# Patient Record
Sex: Male | Born: 1975 | Race: Black or African American | Hispanic: No | Marital: Single | Smoking: Current every day smoker
Health system: Southern US, Community
[De-identification: ages and names within clinical notes are randomized; demographics above are authoritative.]

---

## 2011-03-19 ENCOUNTER — Emergency Department (HOSPITAL_COMMUNITY)
Admission: EM | Admit: 2011-03-19 | Discharge: 2011-03-19 | Disposition: A | Discharge status: Home / Self Care | Payer: Self-pay | Attending: Emergency Medicine | Admitting: Emergency Medicine

## 2011-03-19 DIAGNOSIS — K047 Periapical abscess without sinus: Secondary | ICD-10-CM | POA: Insufficient documentation

## 2011-03-19 DIAGNOSIS — K089 Disorder of teeth and supporting structures, unspecified: Secondary | ICD-10-CM | POA: Insufficient documentation

## 2011-03-19 DIAGNOSIS — I10 Essential (primary) hypertension: Secondary | ICD-10-CM | POA: Insufficient documentation

## 2011-05-11 ENCOUNTER — Emergency Department (HOSPITAL_COMMUNITY)
Admission: EM | Admit: 2011-05-11 | Discharge: 2011-05-12 | Disposition: A | Discharge status: Nursing Home (Includes ICF) | Payer: Self-pay | Attending: Emergency Medicine | Admitting: Emergency Medicine

## 2011-05-11 ENCOUNTER — Emergency Department (HOSPITAL_COMMUNITY): Payer: Self-pay

## 2011-05-11 DIAGNOSIS — K122 Cellulitis and abscess of mouth: Secondary | ICD-10-CM

## 2011-05-11 DIAGNOSIS — R499 Unspecified voice and resonance disorder: Secondary | ICD-10-CM | POA: Insufficient documentation

## 2011-05-11 DIAGNOSIS — L03211 Cellulitis of face: Secondary | ICD-10-CM | POA: Insufficient documentation

## 2011-05-11 DIAGNOSIS — L0201 Cutaneous abscess of face: Secondary | ICD-10-CM | POA: Insufficient documentation

## 2011-05-11 DIAGNOSIS — R1310 Dysphagia, unspecified: Secondary | ICD-10-CM | POA: Insufficient documentation

## 2011-05-11 DIAGNOSIS — R220 Localized swelling, mass and lump, head: Secondary | ICD-10-CM | POA: Insufficient documentation

## 2011-05-11 DIAGNOSIS — F17200 Nicotine dependence, unspecified, uncomplicated: Secondary | ICD-10-CM | POA: Insufficient documentation

## 2011-05-11 DIAGNOSIS — R599 Enlarged lymph nodes, unspecified: Secondary | ICD-10-CM | POA: Insufficient documentation

## 2011-05-11 DIAGNOSIS — M542 Cervicalgia: Secondary | ICD-10-CM | POA: Insufficient documentation

## 2011-05-11 DIAGNOSIS — K029 Dental caries, unspecified: Secondary | ICD-10-CM | POA: Insufficient documentation

## 2011-05-11 DIAGNOSIS — R509 Fever, unspecified: Secondary | ICD-10-CM | POA: Insufficient documentation

## 2011-05-11 DIAGNOSIS — R070 Pain in throat: Secondary | ICD-10-CM | POA: Insufficient documentation

## 2011-05-11 LAB — BASIC METABOLIC PANEL
Chloride: 97 mEq/L (ref 96–112)
Creatinine, Ser: 0.88 mg/dL (ref 0.50–1.35)
GFR calc Af Amer: >60 mL/min (ref >60)
GFR calc non Af Amer: >60 mL/min (ref >60)
Potassium: 3.7 mEq/L (ref 3.5–5.1)

## 2011-05-11 LAB — DIFFERENTIAL
Basophils Absolute: 0 10*3/uL (ref 0.0–0.1)
Basophils Relative: 0 % (ref 0–1)
Lymphocytes Relative: 12 % (ref 12–46)
Monocytes Absolute: 2.1 10*3/uL — ABNORMAL HIGH (ref 0.1–1.0)
Neutro Abs: 12.3 10*3/uL — ABNORMAL HIGH (ref 1.7–7.7)
Neutrophils Relative %: 75 % (ref 43–77)

## 2011-05-11 LAB — CBC
HCT: 44.6 % (ref 39.0–52.0)
Hemoglobin: 15.1 g/dL (ref 13.0–17.0)
MCHC: 33.9 g/dL (ref 30.0–36.0)
RDW: 14.1 % (ref 11.5–15.5)
WBC: 16.5 10*3/uL — ABNORMAL HIGH (ref 4.0–10.5)

## 2011-05-11 LAB — LACTIC ACID, PLASMA: Lactic Acid, Venous: 1 mmol/L (ref 0.5–2.2)

## 2011-05-11 LAB — RAPID STREP SCREEN (MED CTR MEBANE ONLY): Streptococcus, Group A Screen (Direct): NEGATIVE

## 2011-05-11 LAB — PROCALCITONIN: Procalcitonin: 0.16 ng/mL

## 2011-05-11 MED ORDER — IOHEXOL 300 MG/ML  SOLN
100.0000 mL | Freq: Once | INTRAMUSCULAR | Status: AC | PRN
  Administered 2011-05-11: 100 mL via INTRAVENOUS

## 2011-05-11 MED ORDER — PIPERACILLIN-TAZOBACTAM 3.375 G IVPB
3.3750 g | Freq: Once | INTRAVENOUS | Status: AC
  Administered 2011-05-11: 3.375 g via INTRAVENOUS
  Filled 2011-05-11: qty 50

## 2011-05-11 MED ORDER — ACETAMINOPHEN 500 MG PO TABS
1000.0000 mg | ORAL_TABLET | Freq: Once | ORAL | Status: AC
  Administered 2011-05-11: 1000 mg via ORAL
  Filled 2011-05-11: qty 2

## 2011-05-11 MED ORDER — PIPERACILLIN-TAZOBACTAM 3.375 G IVPB
Freq: Three times a day (TID) | INTRAVENOUS | Status: DC
  Filled 2011-05-11: qty 50

## 2011-05-11 MED ORDER — SODIUM CHLORIDE 0.9 % IV SOLN
INTRAVENOUS | Status: DC
  Administered 2011-05-11: 20:00:00 via INTRAVENOUS

## 2011-05-11 MED ORDER — KETOROLAC TROMETHAMINE 60 MG/2ML IM SOLN
60.0000 mg | Freq: Once | INTRAMUSCULAR | Status: AC
  Administered 2011-05-11: 60 mg via INTRAMUSCULAR
  Filled 2011-05-11: qty 2

## 2011-05-11 MED ORDER — SODIUM CHLORIDE 0.9 % IV BOLUS (SEPSIS): 1000.0000 mL | Freq: Once | INTRAVENOUS | Status: DC

## 2011-05-11 MED ORDER — SODIUM CHLORIDE 0.9 % IV SOLN: INTRAVENOUS | Status: DC

## 2011-05-11 MED ORDER — LIDOCAINE VISCOUS 2 % MT SOLN
20.0000 mL | Freq: Once | OROMUCOSAL | Status: AC
  Administered 2011-05-11: 20 mL via OROMUCOSAL
  Filled 2011-05-11: qty 20

## 2011-05-11 MED ORDER — HYDROMORPHONE HCL 1 MG/ML IJ SOLN
1.0000 mg | Freq: Once | INTRAMUSCULAR | Status: DC
  Filled 2011-05-11 (×2): qty 1

## 2011-05-11 NOTE — ED Provider Notes (Addendum)
Pt seen suspect left submandibular cellulitis vs abscess, ordered CT/antibiotics, posterior pharynx appears normal, no stridor/drool or airway compromise. 7:35 PM Caleen Essex, MD 05/11/11 1936  Tooth #19 with large carie and local gingival tenderness suspected source.  Pt stable in ED with no significant deterioration in condition. Patient / Family / Caregiver informed of clinical course, understand medical decision-making process, and agree with plan. Pt accepted by Dr. Carmon Ginsberg at [facility].  Pt to report to ED via [facility].  Hurman Horn, MD 05/13/11 2247  Medical screening examination/treatment/procedure(s) were conducted as a shared visit with non-physician practitioner(s) and myself.  I personally evaluated the patient during the encounter Caleen Essex, MD 05/13/11 2248

## 2011-05-11 NOTE — ED Notes (Signed)
Pt c/o sore throat x 2 days. Difficult to swallow strep test obtained at time of triage. Pt's wife states has had toothache inLt lower back also for several days.  Tooth appears broken. Dificulity seeing tooth secondary to swelling in lt jaw and neck.  Tylenol given for temp

## 2011-05-11 NOTE — ED Notes (Signed)
Pt presents with sore and swollen throat. Pt states symptoms started yesterday.

## 2011-05-11 NOTE — ED Provider Notes (Signed)
History     Chief Complaint  Patient presents with  . Sore Throat   Patient is a 35 y.o. male presenting with pharyngitis.  Sore Throat This is a new problem. The current episode started yesterday. The problem occurs constantly. The problem has been gradually worsening. Associated symptoms include a fever, neck pain, a sore throat and swollen glands. Pertinent negatives include no abdominal pain, arthralgias, chest pain, congestion, coughing, headaches, joint swelling, nausea, numbness, rash or weakness. The symptoms are aggravated by swallowing. He has tried acetaminophen for the symptoms. The treatment provided no relief.    History reviewed. No pertinent past medical history.  History reviewed. No pertinent past surgical history.  History reviewed. No pertinent family history.  History  Substance Use Topics  . Smoking status: Current Everyday Smoker -- 1.0 packs/day    Types: Cigarettes  . Smokeless tobacco: Not on file  . Alcohol Use: No      Review of Systems  Constitutional: Positive for fever.  HENT: Positive for sore throat, facial swelling, trouble swallowing, neck pain and voice change. Negative for congestion and neck stiffness.   Eyes: Negative.   Respiratory: Negative for cough, chest tightness and shortness of breath.   Cardiovascular: Negative for chest pain.  Gastrointestinal: Negative for nausea and abdominal pain.  Genitourinary: Negative.   Musculoskeletal: Negative for joint swelling and arthralgias.  Skin: Negative.  Negative for rash and wound.  Neurological: Negative for dizziness, weakness, light-headedness, numbness and headaches.  Hematological: Negative.   Psychiatric/Behavioral: Negative.     Physical Exam  BP 123/84  Pulse 134  Temp(Src) 101.7 F (38.7 C) (Oral)  Resp 20  Ht 6' (1.829 m)  Wt 291 lb (131.997 kg)  BMI 39.47 kg/m2  SpO2 99%  Physical Exam  Vitals reviewed. Constitutional: He is oriented to person, place, and time. He  appears well-developed and well-nourished.  HENT:  Head: Normocephalic and atraumatic.    Right Ear: External ear normal.  Left Ear: External ear normal.  Nose: Nose normal.  Mouth/Throat: Uvula is midline, oropharynx is clear and moist and mucous membranes are normal. No dental abscesses. No oropharyngeal exudate, posterior oropharyngeal edema or tonsillar abscesses.    Eyes: Conjunctivae are normal.  Neck: No tracheal deviation present. No thyromegaly present.  Cardiovascular: Normal heart sounds and intact distal pulses.  Tachycardia present.   Pulmonary/Chest: Effort normal and breath sounds normal. No stridor. He has no wheezes.  Abdominal: Soft. Bowel sounds are normal. There is no tenderness.  Musculoskeletal: Normal range of motion.  Lymphadenopathy:    He has cervical adenopathy.  Neurological: He is alert and oriented to person, place, and time.  Skin: Skin is warm and dry.  Psychiatric: He has a normal mood and affect.    ED Course  Procedures  Spoke with Dr. Sloan Leiter,  General dentistry (no oral surgeon on call tonight) via [facility].  Deferred to maxillofacial - Dr. Annalee Genta.  MDM       Candis Musa, PA 05/11/11 2249

## 2011-05-12 MED ORDER — ACETAMINOPHEN 500 MG PO TABS
ORAL_TABLET | ORAL | Status: AC
  Administered 2011-05-12
  Filled 2011-05-12: qty 2

## 2011-05-12 NOTE — ED Notes (Signed)
Report given to charlene rn in er.

## 2011-05-13 NOTE — ED Provider Notes (Signed)
Medical screening examination/treatment/procedure(s) were conducted as a shared visit with non-physician practitioner(s) and myself.  I personally evaluated the patient during the encounter  Hurman Horn, MD 05/13/11 [phone]

## 2014-02-15 ENCOUNTER — Ambulatory Visit (HOSPITAL_COMMUNITY): Payer: Self-pay | Admitting: Psychiatry

## 2014-04-17 ENCOUNTER — Ambulatory Visit (INDEPENDENT_AMBULATORY_CARE_PROVIDER_SITE_OTHER): Payer: Self-pay | Admitting: Emergency Medicine

## 2014-04-17 VITALS — BP 122/74 | HR 90 | Temp 98.4°F | Resp 16 | Ht 71.0 in | Wt 310.0 lb

## 2014-04-17 DIAGNOSIS — Z0289 Encounter for other administrative examinations: Secondary | ICD-10-CM

## 2014-04-17 NOTE — Progress Notes (Signed)
[facility] [address], [place] [id number] [phone]  Date:  04/17/2014   Name:  Italyhad L Calandro   DOB:  Jul 24, 1976   MRN:  [id number]  PCP:  No PCP Per Patient    Chief Complaint: Employment Physical   History of Present Illness:  Italyhad L Mcguffin is a 38 y.o. very pleasant male patient who presents with the following:  DOT examination.  Denies other complaint or health concern today.   There are no active problems to display for this patient.   History reviewed. No pertinent past medical history.  History reviewed. No pertinent past surgical history.  History  Substance Use Topics  . Smoking status: Current Every Day Smoker -- 1.00 packs/day    Types: Cigarettes  . Smokeless tobacco: Not on file  . Alcohol Use: No    History reviewed. No pertinent family history.  No Known Allergies  Medication list has been reviewed and updated.  No current outpatient prescriptions on file prior to visit.   No current facility-administered medications on file prior to visit.    Review of Systems:  As per HPI, otherwise negative.    Physical Examination: Filed Vitals:   04/17/14 1233  BP: 122/74  Pulse: 90  Temp: 98.4 F (36.9 C)  Resp: 16   Filed Vitals:   04/17/14 1233  Height: 5\' 11"  (1.803 m)  Weight: 310 lb (140.615 kg)   Body mass index is 43.26 kg/(m^2). Ideal Body Weight: Weight in (lb) to have BMI = 25: 178.9  GEN: WDWN, NAD, Non-toxic, A & O x 3 HEENT: Atraumatic, Normocephalic. Neck supple. No masses, No LAD. Ears and Nose: No external deformity. CV: RRR, No M/G/R. No JVD. No thrill. No extra heart sounds. PULM: CTA B, no wheezes, crackles, rhonchi. No retractions. No resp. distress. No accessory muscle use. ABD: S, NT, ND, +BS. No rebound. No HSM. EXTR: No c/c/e NEURO Normal gait.  PSYCH: Normally interactive. Conversant. Not depressed or anxious appearing.  Calm demeanor.    Assessment and Plan: DOT  certification  Signed,  Phillips OdorJeffery Anderson, MD

## 2014-09-19 ENCOUNTER — Telehealth: Payer: Self-pay | Admitting: *Deleted

## 2014-09-19 NOTE — Telephone Encounter (Signed)
Open in error

## 2016-12-18 ENCOUNTER — Emergency Department (HOSPITAL_COMMUNITY)
Admission: EM | Admit: 2016-12-18 | Discharge: 2016-12-18 | Disposition: A | Discharge status: Home / Self Care | Payer: BLUE CROSS/BLUE SHIELD | Attending: Emergency Medicine | Admitting: Emergency Medicine

## 2016-12-18 ENCOUNTER — Encounter (HOSPITAL_COMMUNITY): Payer: Self-pay | Admitting: Emergency Medicine

## 2016-12-18 DIAGNOSIS — R197 Diarrhea, unspecified: Secondary | ICD-10-CM | POA: Insufficient documentation

## 2016-12-18 DIAGNOSIS — B001 Herpesviral vesicular dermatitis: Secondary | ICD-10-CM | POA: Insufficient documentation

## 2016-12-18 DIAGNOSIS — F17210 Nicotine dependence, cigarettes, uncomplicated: Secondary | ICD-10-CM | POA: Diagnosis not present

## 2016-12-18 DIAGNOSIS — R1030 Lower abdominal pain, unspecified: Secondary | ICD-10-CM | POA: Diagnosis present

## 2016-12-18 DIAGNOSIS — R112 Nausea with vomiting, unspecified: Secondary | ICD-10-CM | POA: Insufficient documentation

## 2016-12-18 LAB — URINALYSIS, ROUTINE W REFLEX MICROSCOPIC
Bacteria, UA: NONE SEEN
Bilirubin Urine: NEGATIVE
Glucose, UA: NEGATIVE mg/dL
Ketones, ur: NEGATIVE mg/dL
LEUKOCYTES UA: NEGATIVE
Nitrite: NEGATIVE
PROTEIN: NEGATIVE mg/dL
Specific Gravity, Urine: 1.016 (ref 1.005–1.030)
pH: 5 (ref 5.0–8.0)

## 2016-12-18 LAB — COMPREHENSIVE METABOLIC PANEL
ALBUMIN: 4.5 g/dL (ref 3.5–5.0)
ALK PHOS: 43 U/L (ref 38–126)
ALT: 42 U/L (ref 17–63)
AST: 33 U/L (ref 15–41)
Anion gap: 7 (ref 5–15)
BILIRUBIN TOTAL: 0.3 mg/dL (ref 0.3–1.2)
BUN: 15 mg/dL (ref 6–20)
CALCIUM: 9.7 mg/dL (ref 8.9–10.3)
CO2: 26 mmol/L (ref 22–32)
CREATININE: 0.87 mg/dL (ref 0.61–1.24)
Chloride: 104 mmol/L (ref 101–111)
GFR calc Af Amer: >60 mL/min (ref >60)
GFR calc non Af Amer: >60 mL/min (ref >60)
GLUCOSE: 120 mg/dL — AB (ref 65–99)
Potassium: 4 mmol/L (ref 3.5–5.1)
SODIUM: 137 mmol/L (ref 135–145)
TOTAL PROTEIN: 7.8 g/dL (ref 6.5–8.1)

## 2016-12-18 LAB — CBC
HEMATOCRIT: 46 % (ref 39.0–52.0)
HEMOGLOBIN: 15.8 g/dL (ref 13.0–17.0)
MCH: 29.1 pg (ref 26.0–34.0)
MCHC: 34.3 g/dL (ref 30.0–36.0)
MCV: 84.7 fL (ref 78.0–100.0)
PLATELETS: 254 10*3/uL (ref 150–400)
RBC: 5.43 MIL/uL (ref 4.22–5.81)
RDW: 14.4 % (ref 11.5–15.5)
WBC: 7.6 10*3/uL (ref 4.0–10.5)

## 2016-12-18 LAB — LIPASE, BLOOD: Lipase: 15 U/L (ref 11–51)

## 2016-12-18 MED ORDER — DIPHENOXYLATE-ATROPINE 2.5-0.025 MG PO TABS: 1.0000 | ORAL_TABLET | Freq: Four times a day (QID) | ORAL | 0 refills | Status: AC | PRN

## 2016-12-18 MED ORDER — VALACYCLOVIR HCL 500 MG PO TABS
2000.0000 mg | ORAL_TABLET | Freq: Once | ORAL | Status: AC
  Administered 2016-12-18: 2000 mg via ORAL
  Filled 2016-12-18: qty 4

## 2016-12-18 MED ORDER — VALACYCLOVIR HCL 500 MG PO TABS: ORAL_TABLET | ORAL | 0 refills | Status: AC

## 2016-12-18 MED ORDER — DIPHENOXYLATE-ATROPINE 2.5-0.025 MG PO TABS
2.0000 | ORAL_TABLET | Freq: Once | ORAL | Status: AC
  Administered 2016-12-18: 2 via ORAL
  Filled 2016-12-18: qty 2

## 2016-12-18 MED ORDER — ONDANSETRON 4 MG PO TBDP: 4.0000 mg | ORAL_TABLET | Freq: Three times a day (TID) | ORAL | 0 refills | Status: AC | PRN

## 2016-12-18 NOTE — ED Triage Notes (Signed)
PT reports upper abd pain since yesterday with n/v/d.

## 2016-12-18 NOTE — ED Provider Notes (Signed)
[facility]-EMERGENCY DEPT Provider Note   CSN: [id number] Arrival date & time: 12/18/16  0708     History   Chief Complaint Chief Complaint  Patient presents with  . Abdominal Pain    HPI Randall Blackburn is a 41 y.o. male presenting with a 24 hour history of nausea, vomiting and diarrhea in association with lower abdominal pain which is improved today, stating he "just feels sore" across his lower abdomen today.  His last episode of diarrhea was at 6 am, reports partially formed, denies blood or mucus in stool, and last episode of emesis occurring yesterday evening.  He denies fevers, chills, dizziness or weakness.  He denies any known contacts with similar symptoms and has had no medicines for his symptoms prior to arrival.  He denies foreign travel and has had no recent foods suspicious for being undercooked or spoiled.    Additionally has complaint of increasing frequency of "cold sores",  Stating for the past 6 months has been getting one every 3 weeks on average. He uses abreva topical which does not seem to help much.  His current cold sore came up yesterday morning.    The history is provided by the patient.    History reviewed. No pertinent past medical history.  There are no active problems to display for this patient.   History reviewed. No pertinent surgical history.     Home Medications    Prior to Admission medications   Medication Sig Start Date End Date Taking? Authorizing Provider  diphenoxylate-atropine (LOMOTIL) 2.5-0.025 MG tablet Take 1 tablet by mouth 4 (four) times daily as needed for diarrhea or loose stools. 12/18/16   Burgess AmorJulie Adalia Pettis, PA-C  ondansetron (ZOFRAN ODT) 4 MG disintegrating tablet Take 1 tablet (4 mg total) by mouth every 8 (eight) hours as needed for nausea or vomiting. 12/18/16   Burgess AmorJulie Benjamen Koelling, PA-C  valACYclovir (VALTREX) 500 MG tablet Take 2000 mg x 1 in 12 hours (4 tablets),  Then start taking one tablet daily for suppression starting tomorrow. 12/18/16    Burgess AmorJulie Takirah Binford, PA-C    Family History History reviewed. No pertinent family history.  Social History Social History  Substance Use Topics  . Smoking status: Current Every Day Smoker    Packs/day: 1.00    Types: Cigarettes  . Smokeless tobacco: Not on file  . Alcohol use No     Allergies   Patient has no known allergies.   Review of Systems Review of Systems  Constitutional: Negative for chills and fever.  HENT: Positive for mouth sores. Negative for congestion and sore throat.   Eyes: Negative.   Respiratory: Negative for chest tightness and shortness of breath.   Cardiovascular: Negative for chest pain.  Gastrointestinal: Positive for abdominal pain, diarrhea, nausea and vomiting.  Genitourinary: Negative.   Musculoskeletal: Negative for arthralgias, joint swelling and neck pain.  Skin: Negative.  Negative for rash and wound.  Neurological: Negative for dizziness, weakness, light-headedness, numbness and headaches.  Psychiatric/Behavioral: Negative.      Physical Exam Updated Vital Signs BP 154/99 (BP Location: Left Arm)   Pulse 110   Temp 98.5 F (36.9 C) (Oral)   Resp 18   Ht 6' (1.829 m)   Wt (!) 139.3 kg   SpO2 97%   BMI 41.64 kg/m   Physical Exam  Constitutional: He appears well-developed and well-nourished.  HENT:  Head: Normocephalic and atraumatic.  Mouth/Throat: Oropharynx is clear and moist.  Herpes labialis lesion left lower lip.  Eyes: Conjunctivae are  normal.  Neck: Normal range of motion.  Cardiovascular: Normal rate, regular rhythm, normal heart sounds and intact distal pulses.   Pulmonary/Chest: Effort normal and breath sounds normal. He has no wheezes.  Abdominal: Soft. Bowel sounds are normal. He exhibits no distension and no mass. There is no tenderness. There is no rebound and no guarding.  Musculoskeletal: Normal range of motion.  Neurological: He is alert.  Skin: Skin is warm and dry.  Psychiatric: He has a normal mood and affect.    Nursing note and vitals reviewed.    ED Treatments / Results  Labs (all labs ordered are listed, but only abnormal results are displayed) Labs Reviewed  COMPREHENSIVE METABOLIC PANEL - Abnormal; Notable for the following:       Result Value   Glucose, Bld 120 (*)    All other components within normal limits  URINALYSIS, ROUTINE W REFLEX MICROSCOPIC - Abnormal; Notable for the following:    Hgb urine dipstick SMALL (*)    All other components within normal limits  LIPASE, BLOOD  CBC    EKG  EKG Interpretation None       Radiology No results found.  Procedures Procedures (including critical care time)  Medications Ordered in ED Medications  valACYclovir (VALTREX) tablet 2,000 mg (not administered)     Initial Impression / Assessment and Plan / ED Course  I have reviewed the triage vital signs and the nursing notes.  Pertinent labs & imaging results that were available during my care of the patient were reviewed by me and considered in my medical decision making (see chart for details).     Pt with probable viral gastroenteritis, exam benign, abdominal exam without guarding or suspicion for acute abdomen.  He was placed on lomotil, first dose given here.  zofran prescribed prn for nausea.  Bland diet, recheck for persistent sx.  He was started on valtrex for acute breakout and then start on daily immunosuppression tx.  Advised he will need pcp for further guidance with this - referrals given.    The patient appears reasonably screened and/or stabilized for discharge and I doubt any other medical condition or other [facility] requiring further screening, evaluation, or treatment in the ED at this time prior to discharge.   Final Clinical Impressions(s) / ED Diagnoses   Final diagnoses:  Nausea vomiting and diarrhea  Herpes labialis    New Prescriptions New Prescriptions   DIPHENOXYLATE-ATROPINE (LOMOTIL) 2.5-0.025 MG TABLET    Take 1 tablet by mouth 4 (four) times  daily as needed for diarrhea or loose stools.   ONDANSETRON (ZOFRAN ODT) 4 MG DISINTEGRATING TABLET    Take 1 tablet (4 mg total) by mouth every 8 (eight) hours as needed for nausea or vomiting.   VALACYCLOVIR (VALTREX) 500 MG TABLET    Take 2000 mg x 1 in 12 hours (4 tablets),  Then start taking one tablet daily for suppression starting tomorrow.     Burgess Amor, PA-C 12/18/16 1610    Eber Hong, MD 12/19/16 [phone]

## 2017-10-24 ENCOUNTER — Encounter (HOSPITAL_COMMUNITY): Payer: Self-pay | Admitting: *Deleted

## 2017-10-24 ENCOUNTER — Emergency Department (HOSPITAL_COMMUNITY)
Admission: EM | Admit: 2017-10-24 | Discharge: 2017-10-24 | Disposition: A | Discharge status: Home / Self Care | Payer: BLUE CROSS/BLUE SHIELD | Attending: Emergency Medicine | Admitting: Emergency Medicine

## 2017-10-24 ENCOUNTER — Emergency Department (HOSPITAL_COMMUNITY): Payer: BLUE CROSS/BLUE SHIELD

## 2017-10-24 DIAGNOSIS — M545 Low back pain, unspecified: Secondary | ICD-10-CM

## 2017-10-24 DIAGNOSIS — F17210 Nicotine dependence, cigarettes, uncomplicated: Secondary | ICD-10-CM | POA: Diagnosis not present

## 2017-10-24 DIAGNOSIS — L723 Sebaceous cyst: Secondary | ICD-10-CM | POA: Diagnosis not present

## 2017-10-24 DIAGNOSIS — Z79899 Other long term (current) drug therapy: Secondary | ICD-10-CM | POA: Diagnosis not present

## 2017-10-24 DIAGNOSIS — X500XXA Overexertion from strenuous movement or load, initial encounter: Secondary | ICD-10-CM | POA: Diagnosis not present

## 2017-10-24 MED ORDER — KETOROLAC TROMETHAMINE 60 MG/2ML IM SOLN
60.0000 mg | Freq: Once | INTRAMUSCULAR | Status: AC
  Administered 2017-10-24: 60 mg via INTRAMUSCULAR
  Filled 2017-10-24: qty 2

## 2017-10-24 MED ORDER — HYDROCODONE-ACETAMINOPHEN 5-325 MG PO TABS: 1.0000 | ORAL_TABLET | ORAL | 0 refills | Status: AC | PRN

## 2017-10-24 MED ORDER — LIDOCAINE HCL (PF) 1 % IJ SOLN
5.0000 mL | Freq: Once | INTRAMUSCULAR | Status: AC
  Administered 2017-10-24: 5 mL
  Filled 2017-10-24: qty 6

## 2017-10-24 MED ORDER — CYCLOBENZAPRINE HCL 5 MG PO TABS: 5.0000 mg | ORAL_TABLET | Freq: Three times a day (TID) | ORAL | 0 refills | Status: AC | PRN

## 2017-10-24 MED ORDER — NAPROXEN 500 MG PO TABS: 500.0000 mg | ORAL_TABLET | Freq: Two times a day (BID) | ORAL | 0 refills | Status: AC

## 2017-10-24 NOTE — ED Triage Notes (Signed)
Ambulatory to triage with a steady gait. Pt reports lower back pain that started last night while at work, pt does loading and unloading type work. Also reports some right hip pain. No meds PTA.

## 2017-10-24 NOTE — Discharge Instructions (Signed)
Take the medicines as directed.  Do not drive within 4 hours of taking hydrocodone as this will make you drowsy.  Avoid lifting,  Bending,  Twisting or any other activity that worsens your pain over the next week.  Apply an  icepack  to your lower back for 10-15 minutes every 2 hours for the next 2 days.  You should get rechecked if your symptoms are not better over the next 5 days,  Or you develop increased pain,  Weakness in your leg(s) or loss of bladder or bowel function - these are symptoms of a worsening condition.  Apply a warm soak (shower) to the site of the sebaceous cyst for 5 minutes daily the next several days.  This should heal without complication.  However as discussed it is possible for this to return slowly over time.

## 2017-10-25 NOTE — ED Provider Notes (Signed)
[facility] EMERGENCY DEPARTMENT Provider Note   CSN: [id number] Arrival date & time: 10/24/17  1851     History   Chief Complaint Chief Complaint  Patient presents with  . Back Pain    HPI Randall Blackburn is a 41 y.o. male presenting with 2 complaints today, the first being midline low back pain which is been present since last night.  He reports having to lift objects up to 120 pounds at work describing big coils of metal when he felt sudden onset of pain in his mid lumbar spine.  He denies radiation of pain in his lower extremities, no weakness or numbness in his legs.  Additionally has had no urinary or fecal incontinence or retention and denies prior history of significant back problems.  He has taken Tylenol with no relief of symptoms.  His second complaint is for a nodule on his left upper back which is been present for at least the past month and has starting to cause some mild discomfort noticed mostly when he lies on his back.  There is been no injury to the site, no drainage or redness and he denies prior similar symptoms at the site.    The history is provided by the patient.    History reviewed. No pertinent past medical history.  There are no active problems to display for this patient.   History reviewed. No pertinent surgical history.     Home Medications    Prior to Admission medications   Medication Sig Start Date End Date Taking? Authorizing Provider  cyclobenzaprine (FLEXERIL) 5 MG tablet Take 1 tablet (5 mg total) by mouth 3 (three) times daily as needed for muscle spasms. 10/24/17   Burgess Amor, PA-C  diphenoxylate-atropine (LOMOTIL) 2.5-0.025 MG tablet Take 1 tablet by mouth 4 (four) times daily as needed for diarrhea or loose stools. 12/18/16   Burgess Amor, PA-C  HYDROcodone-acetaminophen (NORCO/VICODIN) 5-325 MG tablet Take 1 tablet by mouth every 4 (four) hours as needed. 10/24/17   Burgess Amor, PA-C  HYDROcodone-acetaminophen (NORCO/VICODIN) 5-325 MG  tablet Take 1 tablet by mouth every 4 (four) hours as needed. 10/24/17   Burgess Amor, PA-C  naproxen (NAPROSYN) 500 MG tablet Take 1 tablet (500 mg total) by mouth 2 (two) times daily. 10/24/17   Burgess Amor, PA-C  ondansetron (ZOFRAN ODT) 4 MG disintegrating tablet Take 1 tablet (4 mg total) by mouth every 8 (eight) hours as needed for nausea or vomiting. 12/18/16   Zalayah Pizzuto, Raynelle Fanning, PA-C  valACYclovir (VALTREX) 500 MG tablet Take 2000 mg x 1 in 12 hours (4 tablets),  Then start taking one tablet daily for suppression starting tomorrow. 12/18/16   Burgess Amor, PA-C    Family History No family history on file.  Social History Social History   Tobacco Use  . Smoking status: Current Every Day Smoker    Packs/day: 1.00    Types: Cigarettes  Substance Use Topics  . Alcohol use: No  . Drug use: No     Allergies   Patient has no known allergies.   Review of Systems Review of Systems  Constitutional: Negative for fever.  Respiratory: Negative for shortness of breath.   Cardiovascular: Negative for chest pain and leg swelling.  Gastrointestinal: Negative for abdominal distention, abdominal pain and constipation.  Genitourinary: Negative for difficulty urinating, dysuria, flank pain, frequency and urgency.  Musculoskeletal: Positive for back pain. Negative for gait problem and joint swelling.  Skin: Negative for rash.       Negative  except as mentioned in HPI.  Neurological: Negative for weakness and numbness.     Physical Exam Updated Vital Signs BP (!) 166/99   Pulse 99   Temp 97.9 F (36.6 C) (Oral)   Resp 18   Ht 6' (1.829 m)   Wt (!) 142.9 kg (315 lb)   SpO2 99%   BMI 42.72 kg/m   Physical Exam  Constitutional: He appears well-developed and well-nourished.  HENT:  Head: Normocephalic.  Eyes: Conjunctivae are normal.  Neck: Normal range of motion. Neck supple.  Cardiovascular: Normal rate and intact distal pulses.  Pedal pulses normal.  Pulmonary/Chest: Effort normal.   Abdominal: Soft. Bowel sounds are normal. He exhibits no distension and no mass.  Musculoskeletal: Normal range of motion. He exhibits no edema.       Lumbar back: He exhibits bony tenderness. He exhibits normal range of motion, no swelling, no edema, no deformity and no spasm.  Neurological: He is alert. He has normal strength. He displays no atrophy and no tremor. No sensory deficit. Gait normal.  Reflex Scores:      Patellar reflexes are 2+ on the right side and 2+ on the left side.      Achilles reflexes are 2+ on the right side and 2+ on the left side. No strength deficit noted in hip and knee flexor and extensor muscle groups.  Ankle flexion and extension intact.  Skin: Skin is warm and dry.  Patient has an enlarged block poor at his left back lateral to his left scapula with a modestly tender area of deep induration.  There is no surrounding erythema.  Psychiatric: He has a normal mood and affect.  Nursing note and vitals reviewed.    ED Treatments / Results  Labs (all labs ordered are listed, but only abnormal results are displayed) Labs Reviewed - No data to display  EKG  EKG Interpretation None       Radiology Dg Lumbar Spine Complete  Result Date: 10/24/2017 CLINICAL DATA:  41 year old male with back pain. EXAM: LUMBAR SPINE - COMPLETE 4+ VIEW COMPARISON:  None. FINDINGS: There is no acute fracture or subluxation of the lumbar spine. The vertebral body heights and disc spaces are maintained. The visualized posterior elements are intact. The soft tissues are unremarkable. Mild atherosclerotic calcification of the aorta. IMPRESSION: No acute lumbar spine pathology. Electronically Signed   By: Elgie CollardArash  Radparvar M.D.   On: 10/24/2017 21:52    Procedures Procedures (including critical care time)  INCISION AND DRAINAGE Performed by: Burgess AmorIDOL, Charlottie Peragine Consent: Verbal consent obtained. Risks and benefits: risks, benefits and alternatives were discussed Type: sebaceous  cyst  Body area: Left upper back  Anesthesia: local infiltration  Incision was made with a scalpel.  Local anesthetic: lidocaine 1 % without epinephrine  Anesthetic total: 6 ml  Complexity: complex Blunt dissection to break up loculations  Drainage: sebum Drainage amount: Copious  Packing material: None Patient tolerance: Patient tolerated the procedure well with no immediate complications.     Medications Ordered in ED Medications  ketorolac (TORADOL) injection 60 mg (60 mg Intramuscular Given 10/24/17 2156)  lidocaine (PF) (XYLOCAINE) 1 % injection 5 mL (5 mLs Other Given 10/24/17 2156)     Initial Impression / Assessment and Plan / ED Course  I have reviewed the triage vital signs and the nursing notes.  Pertinent labs & imaging results that were available during my care of the patient were reviewed by me and considered in my medical decision making (see chart  for details).     Patient was given Toradol injection while here with some improvement in pain, he is driving home and we were unable to give him any sedating medications.  He had significant improvement in the discomfort at his back after his sebaceous cyst was drained.  He was placed on Naprosyn, Flexeril and hydrocodone.  Discussed activity as tolerated, heat therapy to his back, strict return precautions discussed.  He has no signs suggesting neurosurgical emergency.  Final Clinical Impressions(s) / ED Diagnoses   Final diagnoses:  Acute midline low back pain without sciatica  Sebaceous cyst    ED Discharge Orders        Ordered    naproxen (NAPROSYN) 500 MG tablet  2 times daily     10/24/17 2234    cyclobenzaprine (FLEXERIL) 5 MG tablet  3 times daily PRN     10/24/17 2234    HYDROcodone-acetaminophen (NORCO/VICODIN) 5-325 MG tablet  Every 4 hours PRN     10/24/17 2234    HYDROcodone-acetaminophen (NORCO/VICODIN) 5-325 MG tablet  Every 4 hours PRN     10/24/17 2235       Burgess Amordol, Earlena Werst,  PA-C 10/25/17 Corrie [id number]    Pickering, Nathan, MD 10/26/17 [phone]

## 2017-10-26 MED FILL — Oxycodone w/ Acetaminophen Tab 5-325 MG: ORAL | Qty: 6 | Status: AC

## 2018-08-17 IMAGING — DX DG LUMBAR SPINE COMPLETE 4+V
5 series · 5 of 5 positions shown · non-contrast
Comparison: None.

CLINICAL DATA: 41-year-old male with back pain.

EXAM:
LUMBAR SPINE - COMPLETE 4+ VIEW

[l-spine ap]
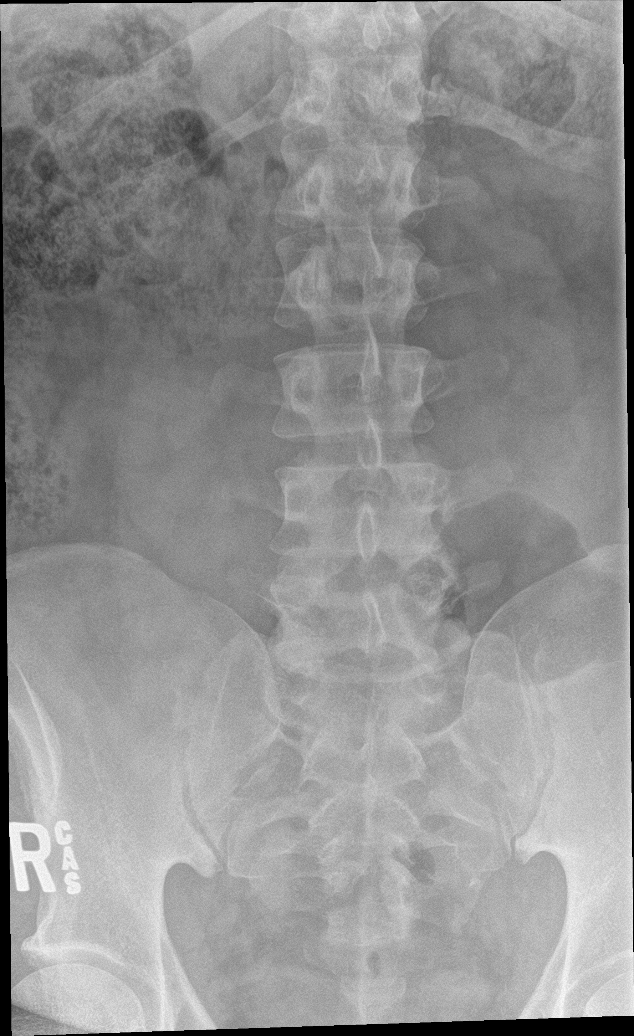

[l-spine obl (1 of 2)]
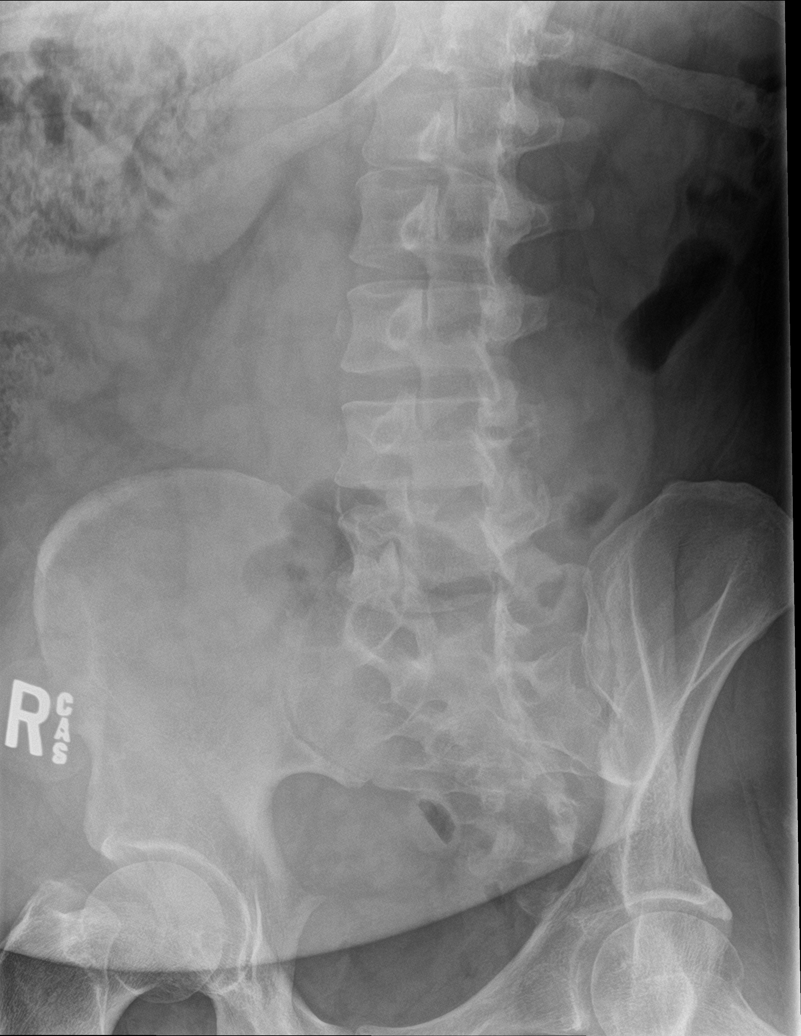

[l-spine obl (2 of 2)]
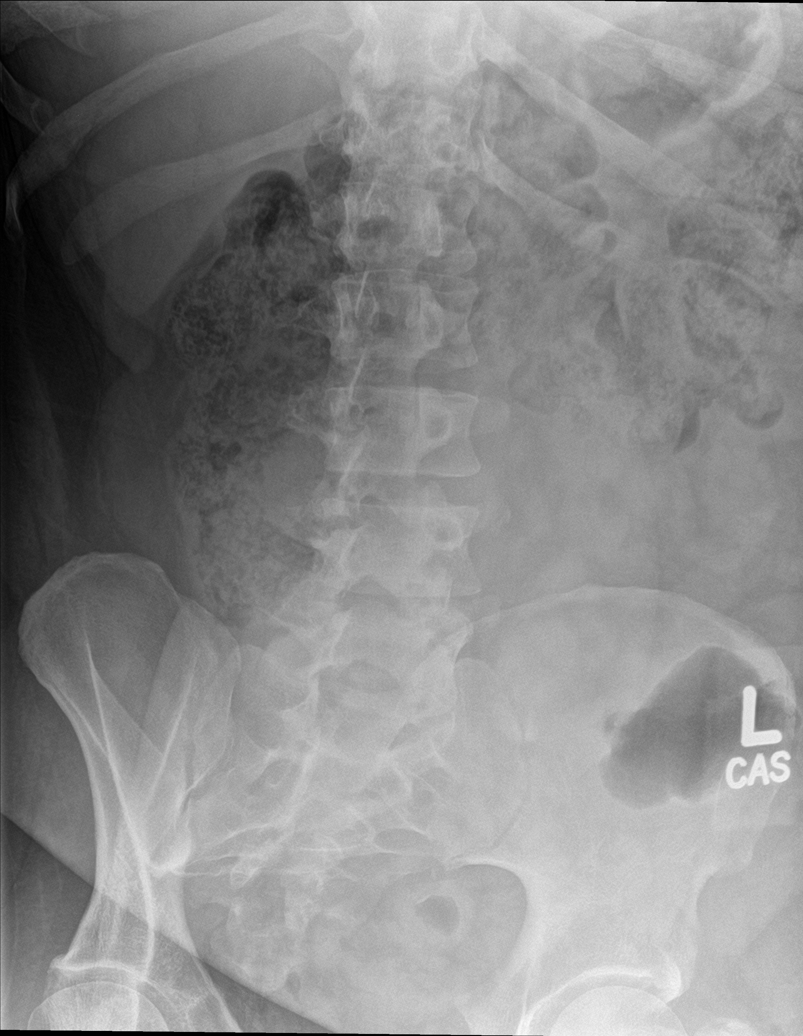

[l-spine lat]
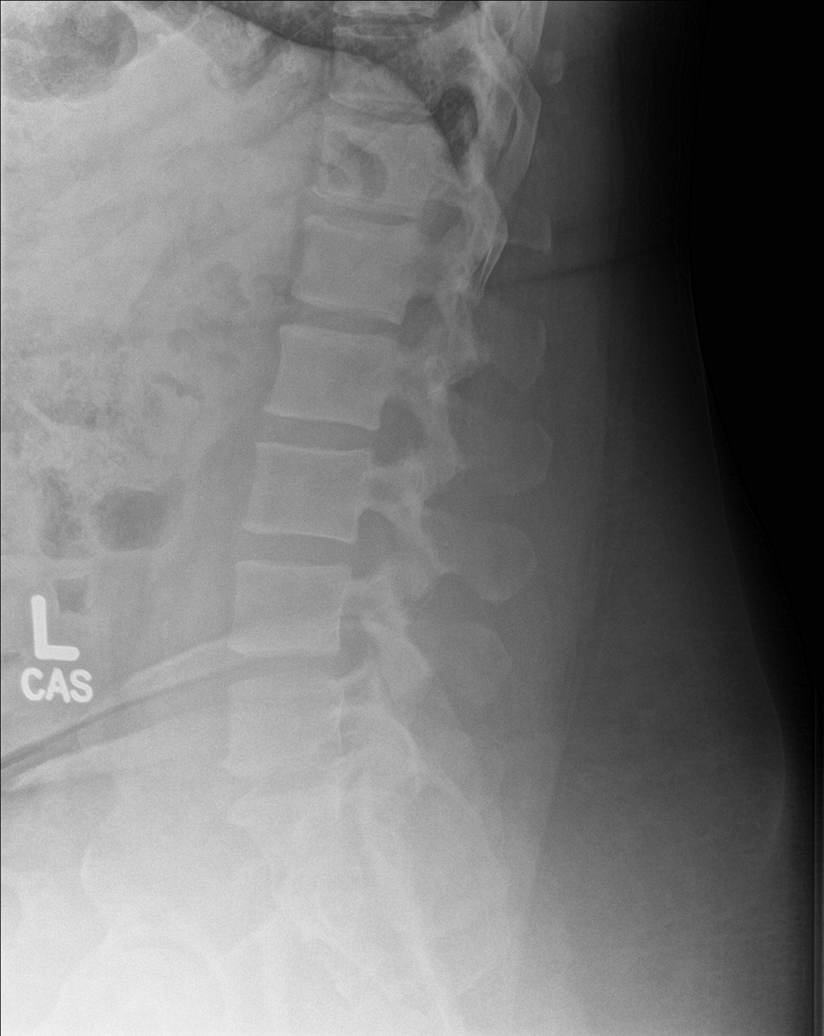

[l-spine spot]
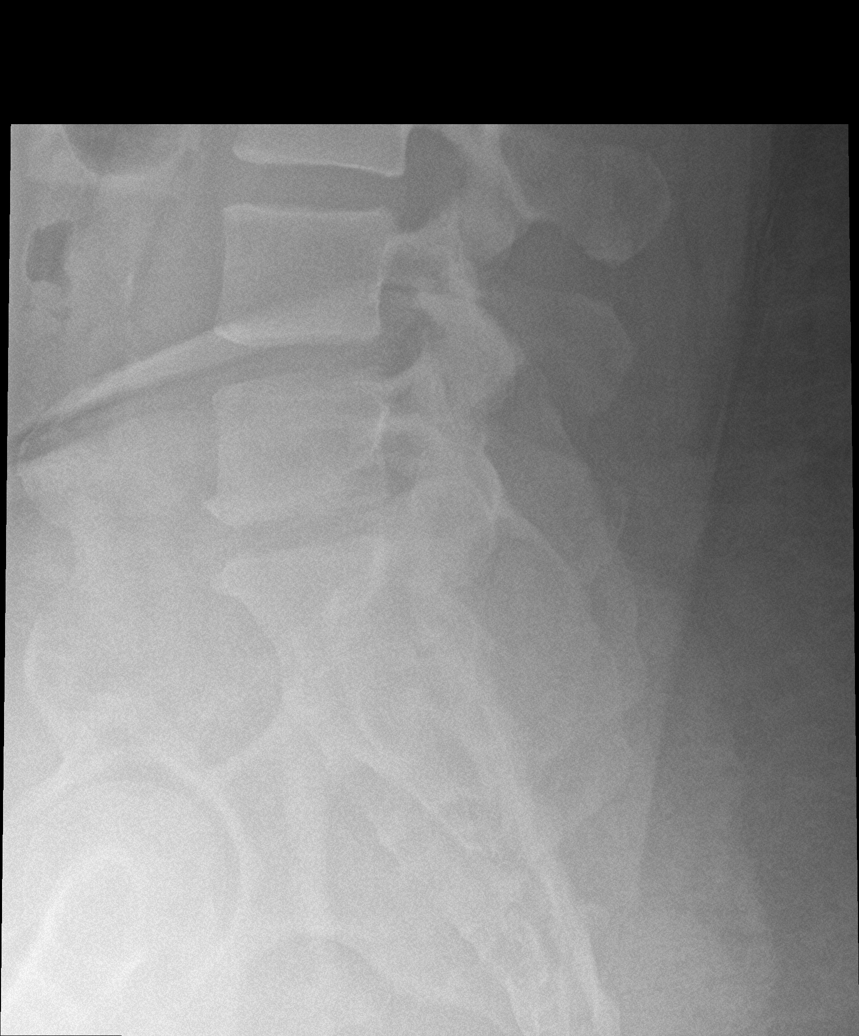

[5 of 5 positions shown; findings below may reference images not displayed]

FINDINGS: There is no acute fracture or subluxation of the lumbar spine. The
vertebral body heights and disc spaces are maintained. The
visualized posterior elements are intact. The soft tissues are
unremarkable.

Mild atherosclerotic calcification of the aorta.
IMPRESSION: No acute lumbar spine pathology.

## 2022-04-15 ENCOUNTER — Other Ambulatory Visit: Payer: Self-pay

## 2022-04-15 ENCOUNTER — Encounter (HOSPITAL_COMMUNITY): Payer: Self-pay | Admitting: *Deleted

## 2022-04-15 ENCOUNTER — Emergency Department (HOSPITAL_COMMUNITY)
Admission: EM | Admit: 2022-04-15 | Discharge: 2022-04-15 | Disposition: A | Discharge status: Home / Self Care | Payer: Self-pay | Attending: Emergency Medicine | Admitting: Emergency Medicine

## 2022-04-15 DIAGNOSIS — H02842 Edema of right lower eyelid: Secondary | ICD-10-CM | POA: Insufficient documentation

## 2022-04-15 DIAGNOSIS — H029 Unspecified disorder of eyelid: Secondary | ICD-10-CM

## 2022-04-15 MED ORDER — ERYTHROMYCIN 5 MG/GM OP OINT
TOPICAL_OINTMENT | Freq: Once | OPHTHALMIC | Status: AC
  Administered 2022-04-15: 1 via OPHTHALMIC
  Filled 2022-04-15: qty 3.5

## 2022-04-15 NOTE — Discharge Instructions (Signed)
Please apply the erythromycin ointment to this lesion 3 times a day, warm compresses, if you are not getting any better within a week you will need to see the eye doctor.  If you do not have an eye doctor see the phone number above

## 2022-04-15 NOTE — ED Provider Notes (Signed)
[facility] EMERGENCY DEPARTMENT Provider Note   CSN: [id number] Arrival date & time: 04/15/22  2952     History  Chief Complaint  Patient presents with   Facial Swelling    Randall Blackburn is a 46 y.o. male.  HPI  This patient is an otherwise healthy 46 year old male, presenting with a complaint of right lower eyelid swelling.  He has a small mass in this area that looks like a small blister.  It is gradually enlarging over a month, sometimes bothersome, sometimes it does not, it does not affect his vision, he has no contact lenses, he has not seen a doctor for this.  Home Medications Prior to Admission medications   Medication Sig Start Date End Date Taking? Authorizing Provider  cyclobenzaprine (FLEXERIL) 5 MG tablet Take 1 tablet (5 mg total) by mouth 3 (three) times daily as needed for muscle spasms. 10/24/17   Burgess Amor, PA-C  diphenoxylate-atropine (LOMOTIL) 2.5-0.025 MG tablet Take 1 tablet by mouth 4 (four) times daily as needed for diarrhea or loose stools. 12/18/16   Burgess Amor, PA-C  HYDROcodone-acetaminophen (NORCO/VICODIN) 5-325 MG tablet Take 1 tablet by mouth every 4 (four) hours as needed. 10/24/17   Burgess Amor, PA-C  HYDROcodone-acetaminophen (NORCO/VICODIN) 5-325 MG tablet Take 1 tablet by mouth every 4 (four) hours as needed. 10/24/17   Burgess Amor, PA-C  naproxen (NAPROSYN) 500 MG tablet Take 1 tablet (500 mg total) by mouth 2 (two) times daily. 10/24/17   Burgess Amor, PA-C  ondansetron (ZOFRAN ODT) 4 MG disintegrating tablet Take 1 tablet (4 mg total) by mouth every 8 (eight) hours as needed for nausea or vomiting. 12/18/16   Idol, Raynelle Fanning, PA-C  valACYclovir (VALTREX) 500 MG tablet Take 2000 mg x 1 in 12 hours (4 tablets),  Then start taking one tablet daily for suppression starting tomorrow. 12/18/16   Burgess Amor, PA-C      Allergies    Patient has no known allergies.    Review of Systems   Review of Systems  Eyes:  Positive for pain.    Physical  Exam Updated Vital Signs BP (!) 169/112   Pulse 84   Temp 98.1 F (36.7 C)   Resp 18   Ht 1.829 m (6')   Wt (!) 154.2 kg   SpO2 96%   BMI 46.11 kg/m  Physical Exam Vitals and nursing note reviewed.  Constitutional:      Appearance: He is well-developed. He is not diaphoretic.  HENT:     Head: Normocephalic and atraumatic.  Eyes:     General:        Right eye: No discharge.        Left eye: No discharge.     Conjunctiva/sclera: Conjunctivae normal.     Comments: 2 mm small papule or growth on the right lower lid at the nasal surface, no redness, no drainage, normal-appearing eyes bilaterally  Pulmonary:     Effort: Pulmonary effort is normal. No respiratory distress.  Skin:    General: Skin is warm and dry.     Findings: No erythema or rash.  Neurological:     Mental Status: He is alert.     Coordination: Coordination normal.     ED Results / Procedures / Treatments   Labs (all labs ordered are listed, but only abnormal results are displayed) Labs Reviewed - No data to display  EKG None  Radiology No results found.  Procedures Procedures    Medications Ordered in ED Medications  erythromycin ophthalmic ointment (has no administration in time range)    ED Course/ Medical Decision Making/ A&P                           Medical Decision Making Risk Prescription drug management.   There is no involvement of the upper lid the conjunctive a sclera the cornea or the pupils.  The vision is normal, the vital signs reflect some hypertension but no other acute findings.  This patient will be given some erythromycin ointment and asked to follow-up with ophthalmology.  He wants me to lance it but it is too close to his eye and not representing an emergency that needs to be emergently treated.  I think warm compresses would be a reasonable alternative at this time.  The patient is agreeable and will be given erythromycin ointment        Final Clinical  Impression(s) / ED Diagnoses Final diagnoses:  Eyelid abnormality    Rx / DC Orders ED Discharge Orders     None         Eber Hong, MD 04/15/22 [phone]

## 2022-04-15 NOTE — ED Triage Notes (Signed)
Pt c/o facial swelling under right eye x one month
